# Patient Record
Sex: Female | Born: 1985 | Hispanic: No | Marital: Single | Smoking: Current some day smoker
Health system: Southern US, Community
[De-identification: ages and names within clinical notes are randomized; demographics above are authoritative.]

## PROBLEM LIST (undated history)

## (undated) DIAGNOSIS — J45909 Unspecified asthma, uncomplicated: Secondary | ICD-10-CM

---

## 2020-04-11 ENCOUNTER — Inpatient Hospital Stay (HOSPITAL_COMMUNITY)
Admission: AD | Admit: 2020-04-11 | Discharge: 2020-04-13 | DRG: 832 | Discharge status: Against Medical Advice | Payer: Medicaid Other | Attending: Obstetrics and Gynecology | Admitting: Obstetrics and Gynecology

## 2020-04-11 ENCOUNTER — Encounter (HOSPITAL_COMMUNITY): Payer: Self-pay | Admitting: Obstetrics and Gynecology

## 2020-04-11 ENCOUNTER — Other Ambulatory Visit: Payer: Self-pay

## 2020-04-11 DIAGNOSIS — O99013 Anemia complicating pregnancy, third trimester: Secondary | ICD-10-CM | POA: Diagnosis present

## 2020-04-11 DIAGNOSIS — O365932 Maternal care for other known or suspected poor fetal growth, third trimester, fetus 2: Secondary | ICD-10-CM | POA: Diagnosis present

## 2020-04-11 DIAGNOSIS — I10 Essential (primary) hypertension: Secondary | ICD-10-CM | POA: Diagnosis present

## 2020-04-11 DIAGNOSIS — O99323 Drug use complicating pregnancy, third trimester: Secondary | ICD-10-CM | POA: Diagnosis present

## 2020-04-11 DIAGNOSIS — Z3A29 29 weeks gestation of pregnancy: Secondary | ICD-10-CM

## 2020-04-11 DIAGNOSIS — O09893 Supervision of other high risk pregnancies, third trimester: Secondary | ICD-10-CM

## 2020-04-11 DIAGNOSIS — J45909 Unspecified asthma, uncomplicated: Secondary | ICD-10-CM | POA: Diagnosis present

## 2020-04-11 DIAGNOSIS — O30049 Twin pregnancy, dichorionic/diamniotic, unspecified trimester: Secondary | ICD-10-CM | POA: Diagnosis present

## 2020-04-11 DIAGNOSIS — E669 Obesity, unspecified: Secondary | ICD-10-CM | POA: Diagnosis present

## 2020-04-11 DIAGNOSIS — O30043 Twin pregnancy, dichorionic/diamniotic, third trimester: Secondary | ICD-10-CM | POA: Diagnosis present

## 2020-04-11 DIAGNOSIS — Z720 Tobacco use: Secondary | ICD-10-CM | POA: Diagnosis present

## 2020-04-11 DIAGNOSIS — O365911 Maternal care for other known or suspected poor fetal growth, first trimester, fetus 1: Secondary | ICD-10-CM | POA: Diagnosis present

## 2020-04-11 DIAGNOSIS — D649 Anemia, unspecified: Secondary | ICD-10-CM | POA: Diagnosis present

## 2020-04-11 DIAGNOSIS — O99513 Diseases of the respiratory system complicating pregnancy, third trimester: Secondary | ICD-10-CM | POA: Diagnosis present

## 2020-04-11 DIAGNOSIS — O0933 Supervision of pregnancy with insufficient antenatal care, third trimester: Secondary | ICD-10-CM | POA: Diagnosis not present

## 2020-04-11 DIAGNOSIS — O4703 False labor before 37 completed weeks of gestation, third trimester: Secondary | ICD-10-CM | POA: Diagnosis not present

## 2020-04-11 DIAGNOSIS — O365990 Maternal care for other known or suspected poor fetal growth, unspecified trimester, not applicable or unspecified: Secondary | ICD-10-CM

## 2020-04-11 DIAGNOSIS — O0970 Supervision of high risk pregnancy due to social problems, unspecified trimester: Secondary | ICD-10-CM

## 2020-04-11 DIAGNOSIS — O99019 Anemia complicating pregnancy, unspecified trimester: Secondary | ICD-10-CM | POA: Diagnosis present

## 2020-04-11 DIAGNOSIS — O99333 Smoking (tobacco) complicating pregnancy, third trimester: Secondary | ICD-10-CM | POA: Diagnosis present

## 2020-04-11 DIAGNOSIS — F32A Depression, unspecified: Secondary | ICD-10-CM | POA: Diagnosis present

## 2020-04-11 DIAGNOSIS — F1290 Cannabis use, unspecified, uncomplicated: Secondary | ICD-10-CM | POA: Diagnosis present

## 2020-04-11 DIAGNOSIS — O99340 Other mental disorders complicating pregnancy, unspecified trimester: Secondary | ICD-10-CM | POA: Diagnosis present

## 2020-04-11 DIAGNOSIS — Z91199 Patient's noncompliance with other medical treatment and regimen due to unspecified reason: Secondary | ICD-10-CM

## 2020-04-11 DIAGNOSIS — Z5329 Procedure and treatment not carried out because of patient's decision for other reasons: Secondary | ICD-10-CM | POA: Diagnosis present

## 2020-04-11 DIAGNOSIS — O09293 Supervision of pregnancy with other poor reproductive or obstetric history, third trimester: Secondary | ICD-10-CM | POA: Diagnosis not present

## 2020-04-11 DIAGNOSIS — O99213 Obesity complicating pregnancy, third trimester: Secondary | ICD-10-CM | POA: Diagnosis present

## 2020-04-11 DIAGNOSIS — Z3A30 30 weeks gestation of pregnancy: Secondary | ICD-10-CM | POA: Diagnosis not present

## 2020-04-11 DIAGNOSIS — O99330 Smoking (tobacco) complicating pregnancy, unspecified trimester: Secondary | ICD-10-CM | POA: Diagnosis present

## 2020-04-11 DIAGNOSIS — Z20822 Contact with and (suspected) exposure to covid-19: Secondary | ICD-10-CM | POA: Diagnosis present

## 2020-04-11 DIAGNOSIS — O99210 Obesity complicating pregnancy, unspecified trimester: Secondary | ICD-10-CM | POA: Diagnosis present

## 2020-04-11 DIAGNOSIS — Z641 Problems related to multiparity: Secondary | ICD-10-CM

## 2020-04-11 DIAGNOSIS — O10913 Unspecified pre-existing hypertension complicating pregnancy, third trimester: Secondary | ICD-10-CM | POA: Diagnosis not present

## 2020-04-11 DIAGNOSIS — O0930 Supervision of pregnancy with insufficient antenatal care, unspecified trimester: Secondary | ICD-10-CM

## 2020-04-11 DIAGNOSIS — O365930 Maternal care for other known or suspected poor fetal growth, third trimester, not applicable or unspecified: Secondary | ICD-10-CM | POA: Diagnosis not present

## 2020-04-11 DIAGNOSIS — O4693 Antepartum hemorrhage, unspecified, third trimester: Secondary | ICD-10-CM | POA: Diagnosis present

## 2020-04-11 DIAGNOSIS — O0943 Supervision of pregnancy with grand multiparity, third trimester: Secondary | ICD-10-CM | POA: Diagnosis not present

## 2020-04-11 DIAGNOSIS — O365912 Maternal care for other known or suspected poor fetal growth, first trimester, fetus 2: Secondary | ICD-10-CM | POA: Diagnosis present

## 2020-04-11 HISTORY — DX: Unspecified asthma, uncomplicated: J45.909

## 2020-04-11 LAB — GLUCOSE, CAPILLARY
Glucose-Capillary: 105 mg/dL — ABNORMAL HIGH (ref 70–99)
Glucose-Capillary: 139 mg/dL — ABNORMAL HIGH (ref 70–99)

## 2020-04-11 LAB — CBC
HCT: 27.9 % — ABNORMAL LOW (ref 36.0–46.0)
Hemoglobin: 8.6 g/dL — ABNORMAL LOW (ref 12.0–15.0)
MCH: 29 pg (ref 26.0–34.0)
MCHC: 30.8 g/dL (ref 30.0–36.0)
MCV: 93.9 fL (ref 80.0–100.0)
Platelets: 317 10*3/uL (ref 150–400)
RBC: 2.97 MIL/uL — ABNORMAL LOW (ref 3.87–5.11)
RDW: 13 % (ref 11.5–15.5)
WBC: 13.7 10*3/uL — ABNORMAL HIGH (ref 4.0–10.5)
nRBC: 0 % (ref 0.0–0.2)

## 2020-04-11 LAB — WET PREP, GENITAL
Sperm: NONE SEEN
Trich, Wet Prep: NONE SEEN
Yeast Wet Prep HPF POC: NONE SEEN

## 2020-04-11 LAB — SARS CORONAVIRUS 2 BY RT PCR (HOSPITAL ORDER, PERFORMED IN ~~LOC~~ HOSPITAL LAB): SARS Coronavirus 2: NEGATIVE

## 2020-04-11 LAB — RAPID URINE DRUG SCREEN, HOSP PERFORMED
Amphetamines: NOT DETECTED
Barbiturates: NOT DETECTED
Benzodiazepines: NOT DETECTED
Cocaine: NOT DETECTED
Opiates: NOT DETECTED
Tetrahydrocannabinol: POSITIVE — AB

## 2020-04-11 LAB — ABO/RH: ABO/RH(D): AB POS

## 2020-04-11 LAB — HIV ANTIBODY (ROUTINE TESTING W REFLEX): HIV Screen 4th Generation wRfx: NONREACTIVE

## 2020-04-11 MED ORDER — LACTATED RINGERS IV SOLN: 500.0000 mL | INTRAVENOUS | Status: DC | PRN

## 2020-04-11 MED ORDER — LACTATED RINGERS IV SOLN: INTRAVENOUS | Status: DC

## 2020-04-11 MED ORDER — NIFEDIPINE ER OSMOTIC RELEASE 30 MG PO TB24
30.0000 mg | ORAL_TABLET | Freq: Once | ORAL | Status: AC
  Administered 2020-04-11: 30 mg via ORAL
  Filled 2020-04-11: qty 1

## 2020-04-11 MED ORDER — LIDOCAINE HCL (PF) 1 % IJ SOLN: 30.0000 mL | INTRAMUSCULAR | Status: DC | PRN

## 2020-04-11 MED ORDER — SOD CITRATE-CITRIC ACID 500-334 MG/5ML PO SOLN: 30.0000 mL | ORAL | Status: DC | PRN

## 2020-04-11 MED ORDER — PENICILLIN G POT IN DEXTROSE 60000 UNIT/ML IV SOLN: 3.0000 10*6.[IU] | INTRAVENOUS | Status: DC

## 2020-04-11 MED ORDER — NIFEDIPINE 10 MG PO CAPS
10.0000 mg | ORAL_CAPSULE | Freq: Once | ORAL | Status: AC
  Administered 2020-04-11: 10 mg via ORAL
  Filled 2020-04-11: qty 1

## 2020-04-11 MED ORDER — SODIUM CHLORIDE 0.9 % IV SOLN: 2.0000 g | Freq: Four times a day (QID) | INTRAVENOUS | Status: DC

## 2020-04-11 MED ORDER — NIFEDIPINE 10 MG PO CAPS: 10.0000 mg | ORAL_CAPSULE | Freq: Three times a day (TID) | ORAL | Status: DC

## 2020-04-11 MED ORDER — BETAMETHASONE SOD PHOS & ACET 6 (3-3) MG/ML IJ SUSP: 12.0000 mg | Freq: Once | INTRAMUSCULAR | Status: DC

## 2020-04-11 MED ORDER — BETAMETHASONE SOD PHOS & ACET 6 (3-3) MG/ML IJ SUSP
12.0000 mg | Freq: Once | INTRAMUSCULAR | Status: AC
  Administered 2020-04-11: 12 mg via INTRAMUSCULAR
  Filled 2020-04-11: qty 5

## 2020-04-11 MED ORDER — ONDANSETRON HCL 4 MG/2ML IJ SOLN
4.0000 mg | Freq: Four times a day (QID) | INTRAMUSCULAR | Status: DC | PRN
  Administered 2020-04-12: 4 mg via INTRAVENOUS
  Filled 2020-04-11: qty 2

## 2020-04-11 MED ORDER — OXYTOCIN BOLUS FROM INFUSION: 333.0000 mL | Freq: Once | INTRAVENOUS | Status: DC

## 2020-04-11 MED ORDER — MAGNESIUM SULFATE BOLUS VIA INFUSION
4.0000 g | Freq: Once | INTRAVENOUS | Status: AC
  Administered 2020-04-11: 4 g via INTRAVENOUS
  Filled 2020-04-11: qty 1000

## 2020-04-11 MED ORDER — ZOLPIDEM TARTRATE 5 MG PO TABS
5.0000 mg | ORAL_TABLET | Freq: Every evening | ORAL | Status: DC | PRN
  Administered 2020-04-12 – 2020-04-13 (×2): 5 mg via ORAL
  Filled 2020-04-11 (×2): qty 1

## 2020-04-11 MED ORDER — SODIUM CHLORIDE 0.9 % IV SOLN
2.0000 g | INTRAVENOUS | Status: DC
  Administered 2020-04-11 (×2): 2 g via INTRAVENOUS
  Filled 2020-04-11 (×2): qty 2000

## 2020-04-11 MED ORDER — HYDROMORPHONE HCL 1 MG/ML IJ SOLN
0.5000 mg | INTRAMUSCULAR | Status: AC | PRN
  Administered 2020-04-12 (×2): 0.5 mg via INTRAVENOUS
  Filled 2020-04-11: qty 1
  Filled 2020-04-11: qty 0.5

## 2020-04-11 MED ORDER — SODIUM CHLORIDE 0.9 % IV SOLN: 5.0000 10*6.[IU] | Freq: Once | INTRAVENOUS | Status: DC

## 2020-04-11 MED ORDER — GENTAMICIN SULFATE 40 MG/ML IJ SOLN: 5.0000 mg/kg | INTRAVENOUS | Status: DC

## 2020-04-11 MED ORDER — MAGNESIUM SULFATE 40 GM/1000ML IV SOLN
2.0000 g/h | INTRAVENOUS | Status: AC
  Administered 2020-04-11 – 2020-04-12 (×2): 2 g/h via INTRAVENOUS
  Filled 2020-04-11 (×2): qty 1000

## 2020-04-11 MED ORDER — OXYTOCIN-SODIUM CHLORIDE 30-0.9 UT/500ML-% IV SOLN: 2.5000 [IU]/h | INTRAVENOUS | Status: DC

## 2020-04-11 MED ORDER — OXYCODONE-ACETAMINOPHEN 5-325 MG PO TABS: 2.0000 | ORAL_TABLET | ORAL | Status: DC | PRN

## 2020-04-11 MED ORDER — FENTANYL CITRATE (PF) 100 MCG/2ML IJ SOLN
50.0000 ug | INTRAMUSCULAR | Status: DC | PRN
  Administered 2020-04-11 – 2020-04-13 (×8): 100 ug via INTRAVENOUS
  Filled 2020-04-11 (×8): qty 2

## 2020-04-11 MED ORDER — ACETAMINOPHEN 325 MG PO TABS: 650.0000 mg | ORAL_TABLET | ORAL | Status: DC | PRN

## 2020-04-11 MED ORDER — OXYCODONE-ACETAMINOPHEN 5-325 MG PO TABS: 1.0000 | ORAL_TABLET | ORAL | Status: DC | PRN

## 2020-04-11 NOTE — Progress Notes (Signed)
Labor Progress Note Julie Owens is a 34 y.o. [id number] at [redacted]w[redacted]d with didi twin IUP who presented with threatened preterm labor and vaginal bleeding.  S: Pt reports increasing pain with contractions s/p last cervical exam. She also reports notable stress given poor quality of sleep for days. No concern of ongoing vaginal bleeding per nursing.  O:  BP (!) 114/56    Pulse 80    Temp 98.3 F (36.8 C) (Oral)    Resp 16    Ht 5\' 5"  (1.651 m)    Wt 101.6 kg    SpO2 98%    BMI 37.28 kg/m  Twin A EFM: 120/mod variability/+ accels/no decels Twin B EFM: 120/mod variability/+ accels/no decels Toco: irritable   CVE: Dilation: 5 Effacement (%): 50 Station: -3 Presentation: Vertex Exam by:: Dr. [email or web address]   A&P: 34 y.o. 34 [redacted]w[redacted]d with didi twin IUP who presented for threatened preterm labor and vaginal bleeding.  #Threatened Preterm Labor with Vaginal Bleeding, DiDi Twins: Confirmed absence of previa at outside hospital with MFM. S/p betamethasone x 2 doses, received first dose during hospitalization at [facility] last night. Start tocolysis with magnesium sulfate 4mg  bolus followed by 2mg /hr maintenance dosing. She then was re-checked with no cervical change but subsequently began contracting after the cervical exam and was given procardia 10 mg x1 at 1840. Given persistent discomfort with contractions, pt received an additional dose of procardia XL 30mg  once at 2157. Will transfer pt to antepartum given unchanged cervical exam at 2100.  #Pain: Given ongoing discomfort with contractions despite IV fentanyl, will transition to dilaudid q4hr prn overnight in addition to prn Ambien for therapeutic rest.    #FWB: Cat I for twin A and twin B  #GBS: unknown, initiated Amp given preterm and concern for precipitous delivery; will transition to penicillin at this time and consider discontinuing if no longer concerns for active labor.  #Poor fetal growth: Anatomy scan not available for review. Per [facility] admission note, last MFM on 08/17 showed EFW of 1100g and 1000g. NICU aware, call for delivery.  #Chronic HTN: Not on medication. History of gHTN in previous pregnancy per chart review. Current blood pressures 100s-110s/50s. Continue to monitor.  #H/o GDM in previous pregnancy: No GTT during current pregnancy. Ordered random glucose level, 105, NPO except for lemonade since last night. Continue q4 glucose checks. Most recent BG checks 105 >139.  #Depression, Anxiety: History of depression, prescribed Lexapro 20mg  daily during pregnancy but did not start it due to concern over potential side effects. Slighlty anxious today. Postpartum SW consult.   #Maternal drug use: UDS positive for amphetamines and cannabis on 08/23. Admits to taking THC gummies but denies amphetamine use including Adderall. Repeat UDS pending, SW consult postpartum.  #Tobacco use during pregnancy: Smoking 3 cigarettes daily. Nicotine patch postpartum.  #Asthma: Chronic problem, not on meds, well-controlled.   #Limited prenatal care: Had 2 prenatal visits. Postpartum SW consult.  [facility], MD 9:12 PM

## 2020-04-11 NOTE — Consult Note (Signed)
Neonatology Consult to Antenatal Patient: 04/11/2020 5:12 PM    I was requested by Dr Adrian Blackwater to see this patient in order to provide antenatal counseling due to  Preterm labor at 29 6/7 weeks Di-Di Twin gestation.    Julie Owens is a 34 y/o G9P6 who was admitted today and is now 29 6/[redacted] weeks GA.  She was admitted to [facility] early yesterday, received BMZ and MgSO4 but left AMA.   Prenatal history significant for limited [facility], polysubstance abuse (+ THC and amphetamine at [facility] on 8/23), cHTN, IUGR and Di-Di twins. She is currently having active labor and around 5 cms dilated.  She is getting her second dose of BMZ, and started on Magnesium Sulfate and IV antibiotics for unknown GBS status.   I spoke with Julie Owens in Room 217.   We discussed in detail what to expect in case of possible delivery of the infant in the next few days including morbidity and mortality at this twin gestational age, usual delivery room resuscitation including intubation and surfactant administration in the DR.  Discussed possible respiratory complications and need for support including mechanical ventilation, IV access, sepsis work-up, NG/OG feedings ( MOB desires breast feeding, which was encouraged), risk for IVH with the potential for motor/cognitive deficits, length of stay and long-term outcome.  She was attentive, had no questions, but expressed appreciation for my input.   Thank you for asking [place] to see this patient and allowing [place] to participate in her care.  Please call if there are any further questions.   Overton Mam, MD (Attending Neonatologist)  Total length of face-to-face or floor/unit time for this encounter was 40 minutes. Counseling and/or coordination of care was greater than fifty percent of the time above.

## 2020-04-11 NOTE — Progress Notes (Signed)
Labor Progress Note Julie Owens is a 34 y.o. [id number] at [redacted]w[redacted]d with didi twin IUP who presented with threatened preterm labor.  S: Doing well overall, not feeling contractions.  O:  BP 109/60    Pulse 78    Temp 98.6 F (37 C) (Oral)    Resp 14    Ht 5\' 5"  (1.651 m)    Wt 101.6 kg    SpO2 98%    BMI 37.28 kg/m  Twin A EFM: 120/mod variability/+ accels/no decels Twin B EFM: 120/mod variability/+ accels/no decels Toco: irritable   CVE: Dilation: 5 Effacement (%): 50 Station: -2 Presentation: Vertex Exam by:: Dr. [email or web address]   A&P: 34 y.o. 34 [redacted]w[redacted]d with didi twin IUP who presented for threatened preterm labor.  #Threatened Preterm Labor, DiDi Twins: S/p betamethasone x 2 doses, received first dose during hospitalization at [facility] last night. Start tocolysis with magnesium sulfate 4mg  bolus followed by 2mg /hr maintenance dosing. She then was re-checked with no cervical change but subsequently began contracting after the cervical exam and was given procardia 10 mg x1, with subsequent resolution in contractions. Will plan for repeat cervical exam in 2-4 hours and move to antepartum if stable.  #Pain: Per patient request #FWB: Cat I for twin A and twin B #GBS unknown, will treat with Amp given preterm and concern for precipitous delivery   #Poor fetal growth: Anatomy scan not available for review. Per [facility] admission note, last MFM on 08/17 showed EFW of 1100g and 1000g. NICU aware, call for delivery.   #Chronic HTN: Not on medication. History of gHTN in previous pregnancy per chart review. Current blood pressures 100s-110s/50s. Continue to monitor.  #H/o GDM in previous pregnancy: No GTT during current pregnancy. Ordered random glucose level, 105, NPO except for lemonade since last night. Will order q4 glucose checks.  #Depression, Anxiety: History of depression, prescribed Lexapro 20mg  daily during pregnancy but did not start it due to concern over potential side  effects. Slighlty anxious today. Postpartum SW consult.   #Maternal drug use: UDS positive for amphetamines and cannabis on 08/23. Admits to taking THC gummies but denies amphetamine use including Adderall. Repeat UDS pending, SW consult postpartum.  #Tobacco use during pregnancy: Smoking 3 cigarettes daily. Nicotine patch postpartum.  #Asthma: Chronic problem, not on meds, well-controlled.   #Limited prenatal care: Had 2 prenatal visits. Postpartum SW consult.  [place], MD 7:05 PM

## 2020-04-11 NOTE — H&P (Signed)
OBSTETRIC ADMISSION HISTORY AND PHYSICAL  Julie Owens is a 34 y.o. female [phone] with IUP at [redacted]w[redacted]d presenting for ctx and VB. She reports intermittent VB x2 days. At times she passes clots. Reports irregular mild ctx. She denies LOF or watery discharge. She reports +FMs. She was admitted to [facility] early yesterday and given BMZ and MgSO4 but she left AMA  Dating: By LMP --->  Estimated Date of Delivery: 06/21/20  Prenatal History/Complications: - didi twins - IUGR - grand multiparity - obesity - limited PNC (2 visits)  Past Medical History: Past Medical History:  Diagnosis Date  . Asthma    Past Surgical History: History reviewed. No pertinent surgical history.  Obstetrical History: OB History    Gravida  9   Para  6   Term  6   Preterm      AB  2   Living  6     SAB  2   TAB      Ectopic      Multiple      Live Births  6           Social History: Social History   Socioeconomic History  . Marital status: Single    Spouse name: Not on file  . Number of children: Not on file  . Years of education: Not on file  . Highest education level: Not on file  Occupational History  . Not on file  Tobacco Use  . Smoking status: Current Some Day Smoker  . Smokeless tobacco: Never Used  Vaping Use  . Vaping Use: Some days  Substance and Sexual Activity  . Alcohol use: Never  . Drug use: Never  . Sexual activity: Yes  Other Topics Concern  . Not on file  Social History Narrative  . Not on file   Social Determinants of Health   Financial Resource Strain:   . Difficulty of Paying Living Expenses: Not on file  Food Insecurity:   . Worried About Programme researcher, broadcasting/film/video in the Last Year: Not on file  . Ran Out of Food in the Last Year: Not on file  Transportation Needs:   . Lack of Transportation (Medical): Not on file  . Lack of Transportation (Non-Medical): Not on file  Physical Activity:   . Days of Exercise per Week: Not on file  . Minutes of  Exercise per Session: Not on file  Stress:   . Feeling of Stress : Not on file  Social Connections:   . Frequency of Communication with Friends and Family: Not on file  . Frequency of Social Gatherings with Friends and Family: Not on file  . Attends Religious Services: Not on file  . Active Member of Clubs or Organizations: Not on file  . Attends Banker Meetings: Not on file  . Marital Status: Not on file    Family History: Family History  Problem Relation Age of Onset  . Diabetes Father     Allergies: No Known Allergies  No medications prior to admission.   Review of Systems:  All systems reviewed and negative except as stated in HPI  PE: Blood pressure 114/60, pulse 89, temperature 98.6 F (37 C), temperature source Oral, resp. rate 12, SpO2 98 %. General appearance: alert, cooperative and no distress Lungs: regular rate and effort Heart: regular rate  Abdomen: soft, non-tender Extremities: Homans sign is negative, no sign of DVT GU: SSE: moderate amt of blood in vault, no active bleeding Presentation: cephalic (  A) EFM-A: 145 bpm, mod variability, + accels, no decels EFM-B: 140 bpm, mod variability, + accels, no decels Toco: rare SVE: 5/60/-2  Prenatal Transfer Tool  Maternal Diabetes: not tested Genetic Screening: Normal Maternal Ultrasounds/Referrals: IUGR Fetal Ultrasounds or other Referrals:  Referred to Materal Fetal Medicine  Maternal Substance Abuse:  No Significant Maternal Medications:  None Significant Maternal Lab Results: None  Assessment: Julie Owens is a 34 y.o. W7P7106 at [redacted]w[redacted]d with didi twins here for threatened PTL and Vaginal bleeding   Plan: Admit to LD BMZ (second dose) MgSO4 PCN Mgt per Dr. Gweneth Dimitri, CNM  04/11/2020, 2:08 PM

## 2020-04-11 NOTE — MAU Note (Addendum)
Pt reports being at [facility] yesterday. Pt reports having an epidural yesterday. Pt reports leaving because she did not think she was getting the right treatment.   Pt reports she was 4 cm yesterday.   Reports vaginal bleeding.   Pt reports when she pees she see's big blood clots.   Denies LOF.    Pt reports decreased fetal movement since leaving the hospital

## 2020-04-11 NOTE — Progress Notes (Signed)
Labor Progress Note Julie Owens is a 34 y.o. [id number] at [redacted]w[redacted]d with didi twin IUP who presented in latent labor.   S: Doing well overall, now feeling contractions every 7-10 minutes.  O:  BP 108/76   Pulse 82   Temp 98.6 F (37 C) (Oral)   Resp 14   Ht 5\' 5"  (1.651 m)   Wt 101.6 kg   SpO2 98%   BMI 37.28 kg/m  Twin A EFM: 140/mod variability/+ accels/no decels Twin B EFM: 140/mod variability/+ accels/no decels  CVE: Dilation: 5 Exam by:: [email or web address], CNM   A&P: 34 y.o. 34 [redacted]w[redacted]d with didi twin gestation presenting for latent labor.  #Threatened Preterm Labor, DiDi Twins: S/p betamethasone x 2 doses, received first dose during hospitalization at [facility] last night. Start tocolysis with magnesium sulfate 4mg  bolus followed by 2mg /hr maintenance dosing.  #Pain: Per patient request #FWB: Cat I for twin A and twin B #GBS unknown, will treat with Amp given preterm and concern for precipitous delivery   #Poor fetal growth: Anatomy scan not available for review. Per [facility] admission note, last MFM on 08/17 showed EFW of 1100g and 1000g. NICU aware, call for delivery.   #Chronic HTN: Not on medication. History of gHTN in previous pregnancy per chart review. Current blood pressures 100s-110s/50s. Continue to monitor.  #H/o GDM in previous pregnancy: No GTT during current pregnancy. Ordered random glucose level, consider additional glucose monitoring throughout labor course depending on result.  #Depression, Anxiety: History of depression, prescribed Lexapro 20mg  daily during pregnancy but did not start it due to concern over potential side effects. Slighlty anxious today. Postpartum SW consult.   #Maternal drug use: UDS positive for amphetamines and cannabis on 08/23. Admits to taking THC gummies but denies amphetamine use including Adderall. Repeat UDS pending, SW consult postpartum.  #Tobacco use during pregnancy: Smoking 3 cigarettes daily. Nicotine patch  postpartum.  #Asthma: Chronic problem, well-controlled. Avoid use of Hemabate.  #Limited prenatal care: Had 2 prenatal visits. Postpartum SW consult.  [place], MD 3:44 PM

## 2020-04-11 NOTE — Progress Notes (Signed)
Patient frustrated when attempting to transfer to antenatal. Patient asked lots of questions and expressed concerns. With explanations provided on plan of care, patient's frustrations increased and expressed desire to leave the hospital. I shared that I would contact [place], DO to come and meet with her to answer any questions and concerns. Patient removed monitors and requested to be saline locked.

## 2020-04-12 ENCOUNTER — Encounter (HOSPITAL_COMMUNITY): Payer: Self-pay | Admitting: Obstetrics and Gynecology

## 2020-04-12 DIAGNOSIS — O4693 Antepartum hemorrhage, unspecified, third trimester: Secondary | ICD-10-CM

## 2020-04-12 DIAGNOSIS — O0970 Supervision of high risk pregnancy due to social problems, unspecified trimester: Secondary | ICD-10-CM

## 2020-04-12 DIAGNOSIS — Z720 Tobacco use: Secondary | ICD-10-CM | POA: Diagnosis present

## 2020-04-12 DIAGNOSIS — O365912 Maternal care for other known or suspected poor fetal growth, first trimester, fetus 2: Secondary | ICD-10-CM | POA: Diagnosis present

## 2020-04-12 DIAGNOSIS — O10913 Unspecified pre-existing hypertension complicating pregnancy, third trimester: Secondary | ICD-10-CM

## 2020-04-12 DIAGNOSIS — Z641 Problems related to multiparity: Secondary | ICD-10-CM

## 2020-04-12 DIAGNOSIS — O30043 Twin pregnancy, dichorionic/diamniotic, third trimester: Secondary | ICD-10-CM

## 2020-04-12 DIAGNOSIS — E669 Obesity, unspecified: Secondary | ICD-10-CM | POA: Diagnosis present

## 2020-04-12 DIAGNOSIS — O09893 Supervision of other high risk pregnancies, third trimester: Secondary | ICD-10-CM

## 2020-04-12 DIAGNOSIS — O365930 Maternal care for other known or suspected poor fetal growth, third trimester, not applicable or unspecified: Secondary | ICD-10-CM

## 2020-04-12 DIAGNOSIS — O99019 Anemia complicating pregnancy, unspecified trimester: Secondary | ICD-10-CM | POA: Diagnosis present

## 2020-04-12 DIAGNOSIS — O4703 False labor before 37 completed weeks of gestation, third trimester: Secondary | ICD-10-CM

## 2020-04-12 DIAGNOSIS — Z3A30 30 weeks gestation of pregnancy: Secondary | ICD-10-CM

## 2020-04-12 DIAGNOSIS — Z91199 Patient's noncompliance with other medical treatment and regimen due to unspecified reason: Secondary | ICD-10-CM

## 2020-04-12 DIAGNOSIS — O99210 Obesity complicating pregnancy, unspecified trimester: Secondary | ICD-10-CM | POA: Diagnosis present

## 2020-04-12 LAB — URINALYSIS, ROUTINE W REFLEX MICROSCOPIC
Bilirubin Urine: NEGATIVE
Glucose, UA: NEGATIVE mg/dL
Ketones, ur: 5 mg/dL — AB
Nitrite: NEGATIVE
Protein, ur: NEGATIVE mg/dL
Specific Gravity, Urine: 1.008 (ref 1.005–1.030)
pH: 6 (ref 5.0–8.0)

## 2020-04-12 LAB — GC/CHLAMYDIA PROBE AMP (~~LOC~~) NOT AT ARMC
Chlamydia: NEGATIVE
Comment: NEGATIVE
Comment: NORMAL
Neisseria Gonorrhea: NEGATIVE

## 2020-04-12 LAB — GLUCOSE, CAPILLARY
Glucose-Capillary: 119 mg/dL — ABNORMAL HIGH (ref 70–99)
Glucose-Capillary: 124 mg/dL — ABNORMAL HIGH (ref 70–99)
Glucose-Capillary: 110 mg/dL — ABNORMAL HIGH (ref 70–99)
Glucose-Capillary: 107 mg/dL — ABNORMAL HIGH (ref 70–99)

## 2020-04-12 LAB — RPR: RPR Ser Ql: NONREACTIVE

## 2020-04-12 LAB — PREPARE RBC (CROSSMATCH)

## 2020-04-12 MED ORDER — PENICILLIN G POT IN DEXTROSE 60000 UNIT/ML IV SOLN: 3.0000 10*6.[IU] | INTRAVENOUS | Status: DC

## 2020-04-12 MED ORDER — PENICILLIN G POT IN DEXTROSE 60000 UNIT/ML IV SOLN
3.0000 10*6.[IU] | INTRAVENOUS | Status: DC
  Administered 2020-04-12 (×2): 3 10*6.[IU] via INTRAVENOUS
  Filled 2020-04-12 (×2): qty 50

## 2020-04-12 MED ORDER — SODIUM CHLORIDE 0.9 % IV SOLN
5.0000 10*6.[IU] | Freq: Once | INTRAVENOUS | Status: AC
  Administered 2020-04-12: 5 10*6.[IU] via INTRAVENOUS
  Filled 2020-04-12: qty 5

## 2020-04-12 MED ORDER — SODIUM CHLORIDE 0.9 % IV SOLN: 5.0000 10*6.[IU] | Freq: Once | INTRAVENOUS | Status: DC

## 2020-04-12 MED ORDER — ALPRAZOLAM 0.5 MG PO TABS
1.0000 mg | ORAL_TABLET | Freq: Once | ORAL | Status: AC
  Administered 2020-04-12: 1 mg via ORAL
  Filled 2020-04-12: qty 2

## 2020-04-12 MED ORDER — SODIUM CHLORIDE 0.9% IV SOLUTION: Freq: Once | INTRAVENOUS | Status: DC

## 2020-04-12 NOTE — Progress Notes (Signed)
Unable to trace babies from 1300 until 1415 due to pt walking around the room.

## 2020-04-12 NOTE — Progress Notes (Signed)
Went to bedside given pt's request to speak with the provider regarding medical indication for continued hospitalization. Social worker also at bedside and discussing contingency plan for pt's other 2 children in the event that she would need to stay in the hospital for an extended period of time. Pt became very tearful with conversation and reported concerns of "wanting to be home with my other children who need me". Chartered loss adjuster discussed medical indication of continued hospitalization including potential life-threatening risk for twins in utero if she were to deliver precipitously at home. Pt endorsed understanding of need for continued hospitalization, at least overnight, with plan to recheck her cervix at 0200 on 8/26, 6 hours s/p discontinuation of magnesium.  Sheila Oats, MD OB Fellow, Faculty Practice 04/12/2020 10:50 PM

## 2020-04-12 NOTE — Progress Notes (Signed)
Down to see patient at request of RN for pressure and contractions. Pt reports increase in contractions and pressure in last 20 min or so, feels it is because she is "worked up" and worried about her other children.   BP 98/72 (BP Location: Left Arm)   Pulse 77   Temp 98.4 F (36.9 C) (Oral)   Resp 18   Ht 5\' 5"  (1.651 m)   Wt 101.6 kg   SpO2 100%   BMI 37.28 kg/m   Gen: alert, oriented, moderate distress, breathing through contractions SVE: 5/50/-3   A/P: 34 yo 34 @ [redacted]w[redacted]d with di/di twins who presents with advanced cervical dilation. Was 5 overnight with increased pressure and contractions now. Cervical exam unchanged from last night but given prematurity, grand multiparity and pressure/contractions, will transfer to L&D for further monitoring.   S/p BTMZ x2 Mag x 48 hours, due to stop this afternoon cHTN   Discussed with Dr. [redacted]w[redacted]d  K. Ashok Pall, M.D. Attending Center for [facility] [employer])

## 2020-04-12 NOTE — Progress Notes (Signed)
Pt called out for RN, stating she was feeling lots of pressure and her ctx were getting much stronger and frequent. Notified Dr. Earlene Plater. Dr. Earlene Plater en route to department.

## 2020-04-12 NOTE — Progress Notes (Signed)
L&D Note  04/12/2020 - 6:00 PM  34 y.o. [id number] [redacted]w[redacted]d. Pregnancy complicated by:  Patient Active Problem List   Diagnosis Date Noted  . Short interval between pregnancies affecting pregnancy in third trimester, antepartum 04/12/2020  . Grand multiparity 04/12/2020  . Poor compliance 04/12/2020  . Supervision of high risk pregnancy due to social problems 04/12/2020  . Tobacco abuse 04/12/2020  . Obesity in pregnancy 04/12/2020  . BMI 30s 04/12/2020  . [redacted] weeks gestation of pregnancy   . Threatened preterm labor, third trimester 04/11/2020  . Dichorionic diamniotic twin pregnancy 04/11/2020  . Substance abuse affecting pregnancy in third trimester, antepartum 04/11/2020  . Limited prenatal care 04/11/2020  . Asthma 04/11/2020  . Tobacco use during pregnancy 04/11/2020  . Poor fetal growth affecting management of mother in third trimester 04/11/2020  . Depression affecting pregnancy 04/11/2020  . Chronic hypertension 04/11/2020    [name] is admitted for PTL   Subjective:  Feeling well. No UCs, VB, LOF  Objective:    Current Vital Signs 24h Vital Sign Ranges  T (!) 97 F (36.1 C) Temp  Avg: 97.9 F (36.6 C)  Min: 97 F (36.1 C)  Max: 98.4 F (36.9 C)  BP 114/67 BP  Min: 98/72  Max: 124/58  HR 77 Pulse  Avg: 76.5  Min: 70  Max: 85  RR 20 Resp  Avg: 16.9  Min: 15  Max: 20  SaO2 100 %  (Room Air) SpO2  Avg: 99.8 %  Min: 99 %  Max: 100 %       24 Hour I/O Current Shift I/O  Time Ins Outs 08/24 0701 - 08/25 0700 In: 5508.8 [P.O.:3010; I.V.:2248.8] Out: 4600 [Urine:4600] 08/25 0701 - 08/25 1900 In: 2151.5 [P.O.:800; I.V.:1101.5] Out: 500 [Urine:500]   FHR:  A 125 baseline, no accel or decel, min to mod variability B 130 baseline, no accel or decel, min to mod variability Toco: quiet Gen: NAD Abdomen: gravid, nttp SVE: 4-5/50/mid position/medium consistency/high/cephalic, BOW intact  Labs:  Recent Labs  Lab 04/11/20 1407  WBC 13.7*  HGB 8.6*  HCT 27.9*   PLT 317   No results for input(s): NA, K, CL, CO2, BUN, CREATININE, CALCIUM, PROT, BILITOT, ALKPHOS, ALT, AST, GLUCOSE in the last 168 hours.  Invalid input(s): LABALBU  Medications Current Facility-Administered Medications  Medication Dose Route Frequency Provider Last Rate Last Admin  . 0.9 %  sodium chloride infusion (Manually program via Guardrails IV Fluids)   Intravenous Once Wouk, Wilfred Curtis, MD      . acetaminophen (TYLENOL) tablet 650 mg  650 mg Oral Q4H PRN Sheila Oats, MD      . fentaNYL (SUBLIMAZE) injection 50-100 mcg  50-100 mcg Intravenous Q1H PRN Sheila Oats, MD   100 mcg at 04/12/20 1529  . lactated ringers infusion 500-1,000 mL  500-1,000 mL Intravenous PRN Sheila Oats, MD      . lactated ringers infusion   Intravenous Continuous Sheila Oats, MD 75 mL/hr at 04/12/20 0910 New Bag at 04/12/20 0910  . lidocaine (PF) (XYLOCAINE) 1 % injection 30 mL  30 mL Subcutaneous PRN Sheila Oats, MD      . ondansetron [facility]) injection 4 mg  4 mg Intravenous Q6H PRN Sheila Oats, MD      . oxyCODONE-acetaminophen (PERCOCET/ROXICET) 5-325 MG per tablet 1 tablet  1 tablet Oral Q4H PRN Sheila Oats, MD      . oxyCODONE-acetaminophen (PERCOCET/ROXICET) 5-325 MG per tablet 2  tablet  2 tablet Oral Q4H PRN Sheila Oats, MD      . oxytocin (PITOCIN) IV BOLUS FROM BAG  333 mL Intravenous Once Sheila Oats, MD      . oxytocin (PITOCIN) IV infusion 30 units in NS 500 mL - Premix  2.5 Units/hr Intravenous Continuous Sheila Oats, MD      . penicillin G potassium 3 Million Units in dextrose 74mL IVPB  3 Million Units Intravenous Q4H Arvilla Market, DO      . sodium citrate-citric acid (ORACIT) solution 30 mL  30 mL Oral Q2H PRN Sheila Oats, MD      . zolpidem (AMBIEN) tablet 5 mg  5 mg Oral QHS PRN Sheila Oats, MD   5 mg at 04/12/20 0002    Assessment & Plan:  Pt stable *IUP: category I-II x 2. Call [facility] tomorrow to try and get mid August  growth u/s results. No mention of abnormal dopplers in care everywhere. Will ask about BTL later. Needs GDM screening at some point.  *PTL: currently on Mg. D/c Mg later tonight and then leave on L&D and if still w/o labor s/s then okay to move to antepartum *Di-Di twins: cephalic/cephalic on ultrasound this morning. I d/w her re: delivery mode and if A or B becomes non cephalic I told her I would recommend c-section due to FGR x 2. If stays cephalic/cephalic then I'm comfortable with her having a vaginal delivery, which she desires.  *Preterm: BMZ #2 on 8/24 @ 1430. S/p Mg for fetal NP. S/p NICU consult *cHTN: no current issues *Anemia: one unit PRBC on hold.  *GBS: unknown. S/p multiple doses of amp and currently on PCN. If okay for transition to antepartum, then can d/c abx *Social: SW PRN *Analgesia: no current needs. No epidural in place  Cornelia Copa. MD Attending Center for [facility])

## 2020-04-12 NOTE — Progress Notes (Signed)
Antenatal Progress Note Julie Owens is a 34 y.o. [id number] at [redacted]w[redacted]d with didi twin IUP who presented with threatened preterm labor and vaginal bleeding.  S: Patient reports mild contractions. Mild vaginal bleeding overnight noted on underwear, none ongoing.   O:  BP (!) 111/56 (BP Location: Left Arm)   Pulse 77   Temp 97.9 F (36.6 C) (Oral)   Resp 16   Ht 5\' 5"  (1.651 m)   Wt 101.6 kg   SpO2 100%   BMI 37.28 kg/m  Twin A EFM: 120/mod variability/+ accels/no decels Twin B EFM: 120/mod variability/+ accels/no decels Toco: irritable   CVE: Dilation: 5 Effacement (%): 50 Station: -3 Presentation: Vertex Exam by:: Dr. [email or web address]  Cervical Exam 5/50/-3    A&P: 34 y.o. 34 [redacted]w[redacted]d with didi twin IUP who presented for threatened preterm labor and vaginal bleeding.  #Threatened Preterm Labor with Vaginal Bleeding, DiDi Twins: On magnesium sulfate, will continue until this afternoon when she is 48 hours after initial betamethasone dose, then continue Procardia XL as ordered.  Continue to monitor closely.   #Pain: Analgesia as needed.    #FWB: Cat I for twin A and twin B  #GBS: Pending at this time PCN needed if PTL  #Poor fetal growth: Anatomy scan not available for review. Per [facility] admission note, last MFM [facility] on 08/17 showed EFW of 1100g and 1000g. NICU aware, call for delivery.  #Chronic HTN: Not on medication. History of gHTN in previous pregnancy per chart review. Current blood pressures 100s-110s/50s. Continue to monitor.   9/17, MD, FACOG Obstetrician & Gynecologist, [facility] for [facility] 11:25 AM

## 2020-04-13 ENCOUNTER — Inpatient Hospital Stay (HOSPITAL_BASED_OUTPATIENT_CLINIC_OR_DEPARTMENT_OTHER): Payer: Medicaid Other

## 2020-04-13 ENCOUNTER — Inpatient Hospital Stay: Payer: Medicaid Other

## 2020-04-13 DIAGNOSIS — O09893 Supervision of other high risk pregnancies, third trimester: Secondary | ICD-10-CM

## 2020-04-13 DIAGNOSIS — J45909 Unspecified asthma, uncomplicated: Secondary | ICD-10-CM

## 2020-04-13 DIAGNOSIS — O99891 Other specified diseases and conditions complicating pregnancy: Secondary | ICD-10-CM

## 2020-04-13 DIAGNOSIS — O09293 Supervision of pregnancy with other poor reproductive or obstetric history, third trimester: Secondary | ICD-10-CM

## 2020-04-13 DIAGNOSIS — O99333 Smoking (tobacco) complicating pregnancy, third trimester: Secondary | ICD-10-CM

## 2020-04-13 DIAGNOSIS — O0943 Supervision of pregnancy with grand multiparity, third trimester: Secondary | ICD-10-CM

## 2020-04-13 DIAGNOSIS — O365930 Maternal care for other known or suspected poor fetal growth, third trimester, not applicable or unspecified: Secondary | ICD-10-CM

## 2020-04-13 DIAGNOSIS — O30043 Twin pregnancy, dichorionic/diamniotic, third trimester: Secondary | ICD-10-CM

## 2020-04-13 DIAGNOSIS — O0933 Supervision of pregnancy with insufficient antenatal care, third trimester: Secondary | ICD-10-CM | POA: Diagnosis not present

## 2020-04-13 DIAGNOSIS — O99213 Obesity complicating pregnancy, third trimester: Secondary | ICD-10-CM

## 2020-04-13 DIAGNOSIS — E669 Obesity, unspecified: Secondary | ICD-10-CM

## 2020-04-13 DIAGNOSIS — O4693 Antepartum hemorrhage, unspecified, third trimester: Secondary | ICD-10-CM

## 2020-04-13 DIAGNOSIS — O99323 Drug use complicating pregnancy, third trimester: Secondary | ICD-10-CM

## 2020-04-13 DIAGNOSIS — Z363 Encounter for antenatal screening for malformations: Secondary | ICD-10-CM

## 2020-04-13 DIAGNOSIS — F1910 Other psychoactive substance abuse, uncomplicated: Secondary | ICD-10-CM

## 2020-04-13 DIAGNOSIS — Z3A30 30 weeks gestation of pregnancy: Secondary | ICD-10-CM

## 2020-04-13 DIAGNOSIS — O10013 Pre-existing essential hypertension complicating pregnancy, third trimester: Secondary | ICD-10-CM

## 2020-04-13 DIAGNOSIS — F17200 Nicotine dependence, unspecified, uncomplicated: Secondary | ICD-10-CM

## 2020-04-13 LAB — CULTURE, BETA STREP (GROUP B ONLY)

## 2020-04-13 LAB — GLUCOSE, CAPILLARY
Glucose-Capillary: 95 mg/dL (ref 70–99)
Glucose-Capillary: 93 mg/dL (ref 70–99)

## 2020-04-13 NOTE — Progress Notes (Addendum)
FACULTY PRACTICE ANTEPARTUM PROGRESS NOTE  Julie Owens is a 34 y.o. [id number] at [id number] who is admitted for Preterm labor.  Estimated Date of Delivery: 06/21/20 Fetal presentation is cephalic and cephalic.  Length of Stay:  2 Days. Admitted 04/11/2020  Subjective:  Patient reports normal fetal movement.  She denies uterine contractions, denies bleeding and leaking of fluid per vagina. Does report some very minor spotting last night. She is feeling well and wondering when she can leave  Vitals:  Blood pressure 123/72, pulse 72, temperature 98.7 F (37.1 C), temperature source Oral, resp. rate 18, height 5\' 5"  (1.651 m), weight 101.6 kg, SpO2 100 %. Physical Examination: CONSTITUTIONAL: Well-developed, well-nourished female in no acute distress.  HENT:  Normocephalic, atraumatic, External right and left ear normal. Oropharynx is clear and moist EYES: Conjunctivae and EOM are normal. Pupils are equal, round, and reactive to light. No scleral icterus.  NECK: Normal range of motion, supple, no masses. SKIN: Skin is warm and dry. No rash noted. Not diaphoretic. No erythema. No pallor. NEUROLGIC: Alert and oriented to person, place, and time. Normal reflexes, muscle tone coordination. No cranial nerve deficit noted. PSYCHIATRIC: Normal mood and affect. Normal behavior. Normal judgment and thought content. CARDIOVASCULAR: Normal heart rate noted RESPIRATORY: Effort normal, no problems with respiration noted MUSCULOSKELETAL: Normal range of motion. No edema and no tenderness. ABDOMEN: Soft, nontender, nondistended, gravid. CERVIX: deferred  Fetal monitoring: B FHR: 125 bpm, Variability: moderate, Accelerations: Present, Decelerations: Absent A: difficult to trace due to maternal position FHR: 125 bpm, moderate variabiliyt, accels present, no decels  Uterine activity: no contractions per hour  Results for orders placed or performed during the hospital encounter of 04/11/20 (from the past 48 hour(s))   Wet prep, genital     Status: Abnormal   Collection Time: 04/11/20  2:07 PM  Result Value Ref Range   Yeast Wet Prep HPF POC NONE SEEN NONE SEEN   Trich, Wet Prep NONE SEEN NONE SEEN   Clue Cells Wet Prep HPF POC PRESENT (A) NONE SEEN   WBC, Wet Prep HPF POC FEW (A) NONE SEEN   Sperm NONE SEEN     Comment: Performed at [facility], [address] [place], [place] [address]  GC/Chlamydia probe amp ([place])not at [facility]     Status: None   Collection Time: 04/11/20  2:07 PM  Result Value Ref Range   Neisseria Gonorrhea Negative    Chlamydia Negative    Comment Normal Reference Ranger Chlamydia - Negative    Comment      Normal Reference Range Neisseria Gonorrhea - Negative  CBC     Status: Abnormal   Collection Time: 04/11/20  2:07 PM  Result Value Ref Range   WBC 13.7 (H) 4.0 - 10.5 K/uL   RBC 2.97 (L) 3.87 - 5.11 MIL/uL   Hemoglobin 8.6 (L) 12.0 - 15.0 g/dL   HCT 81.127.9 (L) 36 - 46 %   MCV 93.9 80.0 - 100.0 fL   MCH 29.0 26.0 - 34.0 pg   MCHC 30.8 30.0 - 36.0 g/dL   RDW 91.413.0 78.211.5 - 95.615.5 %   Platelets 317 150 - 400 K/uL   nRBC 0.0 0.0 - 0.2 %    Comment: Performed at [facility], [address] [place], [place] [address]  RPR     Status: None   Collection Time: 04/11/20  2:07 PM  Result Value Ref Range   RPR Ser Ql NON REACTIVE NON REACTIVE  Comment: Performed at [facility], [address], [place], [place] [address]  HIV Antibody (routine testing w rflx)     Status: None   Collection Time: 04/11/20  2:07 PM  Result Value Ref Range   HIV Screen 4th Generation wRfx Non Reactive Non Reactive    Comment: Performed at [facility], [address], [place], [place] [address]  Type and screen [facility]     Status: None (Preliminary result)   Collection Time: 04/11/20  2:17 PM  Result Value Ref Range   ABO/RH(D) AB POS    Antibody Screen NEG    Sample Expiration      04/14/2020,2359 Performed at [facility], [address] [place], [place] [address]    Unit Number [id number]    Blood Component Type RED CELLS,LR    Unit division 00    Status of Unit ALLOCATED    Transfusion Status OK TO TRANSFUSE    Crossmatch Result Compatible   Culture, beta strep (group b only)     Status: None (Preliminary result)   Collection Time: 04/11/20  2:17 PM   Specimen: Vaginal/Rectal; Genital  Result Value Ref Range   Specimen Description VAGINAL/RECTAL    Special Requests NONE    Culture      CULTURE REINCUBATED FOR BETTER GROWTH Performed at [facility], [address] [place], [place] [address]    Report Status PENDING   ABO/Rh     Status: None   Collection Time: 04/11/20  3:52 PM  Result Value Ref Range   ABO/RH(D)      AB POS Performed at [facility], [address] [place], [place] [address]   SARS Coronavirus 2 by RT PCR (hospital order, performed in [facility] hospital lab) Nasopharyngeal Nasopharyngeal Swab     Status: None   Collection Time: 04/11/20  4:52 PM   Specimen: Nasopharyngeal Swab  Result Value Ref Range   SARS Coronavirus 2 NEGATIVE NEGATIVE    Comment: (NOTE) SARS-CoV-2 target nucleic acids are NOT DETECTED.  The SARS-CoV-2 RNA is generally detectable in upper and lower respiratory specimens during the acute phase of infection. The lowest concentration of SARS-CoV-2 viral copies this assay can detect is 250 copies / mL. A negative result does not preclude SARS-CoV-2 infection and should not be used as the sole basis for treatment or other patient management decisions.  A negative result may occur with improper specimen collection / handling, submission of specimen other than nasopharyngeal swab, presence of viral mutation(s) within the areas targeted by this assay, and inadequate number of viral copies (<250 copies / mL). A negative result must be combined with clinical observations, patient history, and epidemiological information.  Fact Sheet for Patients:    BoilerBrush.com.cy  Fact Sheet for Healthcare Providers: https://pope.com/  This test is not yet approved or  cleared by the [place] FDA and has been authorized for detection and/or diagnosis of SARS-CoV-2 by FDA under an Emergency Use Authorization (EUA).  This EUA will remain in effect (meaning this test can be used) for the duration of the COVID-19 declaration under Section 564(b)(1) of the Act, 21 U.S.C. section 360bbb-3(b)(1), unless the authorization is terminated or revoked sooner.  Performed at [facility], [address], [place], [place] [address]   Glucose, capillary     Status: Abnormal   Collection Time: 04/11/20  5:18 PM  Result Value Ref Range   Glucose-Capillary 105 (  H) 70 - 99 mg/dL    Comment: Glucose reference range applies only to samples taken after fasting for at least 8 hours.  Urine rapid drug screen (hosp performed)     Status: Abnormal   Collection Time: 04/11/20  5:23 PM  Result Value Ref Range   Opiates NONE DETECTED NONE DETECTED   Cocaine NONE DETECTED NONE DETECTED   Benzodiazepines NONE DETECTED NONE DETECTED   Amphetamines NONE DETECTED NONE DETECTED   Tetrahydrocannabinol POSITIVE (A) NONE DETECTED   Barbiturates NONE DETECTED NONE DETECTED    Comment: (NOTE) DRUG SCREEN FOR MEDICAL PURPOSES ONLY.  IF CONFIRMATION IS NEEDED FOR ANY PURPOSE, NOTIFY LAB WITHIN 5 DAYS.  LOWEST DETECTABLE LIMITS FOR URINE DRUG SCREEN Drug Class                     Cutoff (ng/mL) Amphetamine and metabolites    1000 Barbiturate and metabolites    200 Benzodiazepine                 200 Tricyclics and metabolites     300 Opiates and metabolites        300 Cocaine and metabolites        300 THC                            50 Performed at [facility], [address], [place], [place] [address]   Urinalysis, Routine w reflex microscopic Urine, Clean Catch     Status: Abnormal   Collection Time:  04/11/20  5:23 PM  Result Value Ref Range   Color, Urine YELLOW YELLOW   APPearance CLEAR CLEAR   Specific Gravity, Urine 1.008 1.005 - 1.030   pH 6.0 5.0 - 8.0   Glucose, UA NEGATIVE NEGATIVE mg/dL   Hgb urine dipstick LARGE (A) NEGATIVE   Bilirubin Urine NEGATIVE NEGATIVE   Ketones, ur 5 (A) NEGATIVE mg/dL   Protein, ur NEGATIVE NEGATIVE mg/dL   Nitrite NEGATIVE NEGATIVE   Leukocytes,Ua TRACE (A) NEGATIVE   RBC / HPF 0-5 0 - 5 RBC/hpf   WBC, UA 6-10 0 - 5 WBC/hpf   Bacteria, UA RARE (A) NONE SEEN   Squamous Epithelial / LPF 6-10 0 - 5   Mucus PRESENT     Comment: Performed at [facility], [address], [place], [place] [address]  Glucose, capillary     Status: Abnormal   Collection Time: 04/11/20  9:12 PM  Result Value Ref Range   Glucose-Capillary 139 (H) 70 - 99 mg/dL    Comment: Glucose reference range applies only to samples taken after fasting for at least 8 hours.  Glucose, capillary     Status: Abnormal   Collection Time: 04/12/20  5:31 AM  Result Value Ref Range   Glucose-Capillary 119 (H) 70 - 99 mg/dL    Comment: Glucose reference range applies only to samples taken after fasting for at least 8 hours.  Glucose, capillary     Status: Abnormal   Collection Time: 04/12/20  8:52 AM  Result Value Ref Range   Glucose-Capillary 124 (H) 70 - 99 mg/dL    Comment: Glucose reference range applies only to samples taken after fasting for at least 8 hours.  Glucose, capillary     Status: Abnormal   Collection Time: 04/12/20 12:15 PM  Result Value Ref Range   Glucose-Capillary 110 (H) 70 - 99 mg/dL    Comment: Glucose  reference range applies only to samples taken after fasting for at least 8 hours.  Prepare RBC (crossmatch)     Status: None   Collection Time: 04/12/20  3:30 PM  Result Value Ref Range   Order Confirmation      ORDER PROCESSED BY BLOOD BANK Performed at [facility], [address], [place], [place] [address]   Glucose, capillary     Status:  Abnormal   Collection Time: 04/12/20 11:23 PM  Result Value Ref Range   Glucose-Capillary 107 (H) 70 - 99 mg/dL    Comment: Glucose reference range applies only to samples taken after fasting for at least 8 hours.  Glucose, capillary     Status: None   Collection Time: 04/13/20  8:15 AM  Result Value Ref Range   Glucose-Capillary 95 70 - 99 mg/dL    Comment: Glucose reference range applies only to samples taken after fasting for at least 8 hours.  Glucose, capillary     Status: None   Collection Time: 04/13/20 12:10 PM  Result Value Ref Range   Glucose-Capillary 93 70 - 99 mg/dL    Comment: Glucose reference range applies only to samples taken after fasting for at least 8 hours.    I have reviewed the patient's current medications.  ASSESSMENT: Active Problems:   Threatened preterm labor, third trimester   Dichorionic diamniotic twin pregnancy   Substance abuse affecting pregnancy in third trimester, antepartum   Limited prenatal care   Asthma   Tobacco use during pregnancy   IUGR (intrauterine growth restriction) affecting care of mother, first trimester, fetus 1   Depression affecting pregnancy   Chronic hypertension   [redacted] weeks gestation of pregnancy   Short interval between pregnancies affecting pregnancy in third trimester, antepartum   Grand multiparity   Poor compliance   Supervision of high risk pregnancy due to social problems   Tobacco abuse   Obesity in pregnancy   BMI 30s   Anemia in pregnancy   IUGR (intrauterine growth restriction) affecting care of mother, first trimester, fetus 2   PLAN:  Preterm labor - cont inhouse monitoring - reviewed importance of in house management for now, given risks of prematurity of babies if they are born at 67 w, she verbalizes understanding  Di/di twins/FWB - daily NST - s/p BTMZ 8/23-24 - s/p NICU consult  FGR - per notes at [facility], EFW 1100 & 1000g - growth [place] with dopplers today  H/o GDM - no GTT in  pregnancy - CBGs here wnl  cHTN - no meds - normotensive  Delivery mode - vtx/vtx - plan for vaginal delivery  Routine prenatal - will consider allowing [place] to do GTT tomorrow - declines to discuss contraception  Continue routine antenatal care.   Baldemar Lenis, M.D. Attending [facility] for [employer] (Faculty Practice)  04/13/2020 1:10 PM

## 2020-04-13 NOTE — Progress Notes (Signed)
Notified Dr. Vergie Living of difficulty keeping twins on continuous tracing from 0300-0600. Both FHR heard and differentiated. Orders to discontinue continuous monitoring and make daily NST.

## 2020-04-13 NOTE — Progress Notes (Signed)
Answered patient's request to speak to her nurse as assigned nurse was at lunch.  Patient requesting to go home and wants to speak to her doctor.  Phoned Dr. Earlene Plater with patient's request and she will be here to see the patient soon.

## 2020-04-13 NOTE — Discharge Summary (Signed)
Antenatal Physician Discharge Summary   PATIENT LEFT AGAINST MEDICAL ADVICE.  Patient ID: Julie Owens MRN: [id number] DOB/AGE: [birth year] 34 y.o.  Admit date: 04/11/2020 Discharge date: 04/13/2020  Admission Diagnoses: preterm labor  Discharge Diagnoses:  Active Problems:   Threatened preterm labor, third trimester   Dichorionic diamniotic twin pregnancy   Substance abuse affecting pregnancy in third trimester, antepartum   Limited prenatal care   Asthma   Tobacco use during pregnancy   IUGR (intrauterine growth restriction) affecting care of mother, first trimester, fetus 1   Depression affecting pregnancy   Chronic hypertension   [redacted] weeks gestation of pregnancy   Short interval between pregnancies affecting pregnancy in third trimester, antepartum   Grand multiparity   Poor compliance   Supervision of high risk pregnancy due to social problems   Tobacco abuse   Obesity in pregnancy   BMI 30s   Anemia in pregnancy   IUGR (intrauterine growth restriction) affecting care of mother, first trimester, fetus 2   Prenatal Procedures: NST and ultrasound  Consults: Neonatology  Hospital Course:  This is a 34 y.o. [id number] with IUP at [redacted]w[redacted]d admitted for contractions and intermittent vaginal bleeding. Previously admitted to [facility], given MgSO4 and BTMZ and left AMA. Presented here on 04/11/20 with bleeding and contractions. Pregnancy complicated by di/di twins, IUGR, cHTN, grand multiparity, limited PNC. She was found to be 5/60/-2 and admitted to labor. Started on GBS prophylaxis and magnesium for tocolysis. Received 2nd dose of betamethasone. Her contractions spaced out and eventually stopped and she was transferred to [facility]. Had increased contractions the following day and was transferred back to labor as she was very uncomfortable with pressure and unchanged cervical exam, however after she calmed, her contractions spaced out again. HD#3, patient had growth [place] and was  requesting to leave. RN asked me to come speak with her after patient told her she was leaving. I reviewed recommendation to stay in house given advanced cervical dilation and risks of fetal prematurity and death with potential delivery outside of hospital, reviewed maternal risks. Pt verbalizes understanding of risks and states that she plans to go home and rest on couch. States she needs to go take care of her other children. Plans to return to home in [place]. I again reviewed recommendation to stay in house given risks of fetal death, prematurity, maternal risks of hemorrhage with delivery. She verbalizes understanding and requests to leave. Pt signed AMA paperwork with RN. I asked her to call [facility] to have prenatal appointment tomorrow and she states she will do so.   Pt left AGAINST MEDICAL ADVICE.  Discharge Exam: Temp:  [97 F (36.1 C)-98.8 F (37.1 C)] 98.7 F (37.1 C) (08/26 1209) Pulse Rate:  [59-84] 72 (08/26 1209) Resp:  [17-20] 18 (08/26 1209) BP: (87-127)/(40-72) 123/72 (08/26 1209) SpO2:  [99 %-100 %] 100 % (08/26 1209) Weight:  [101.6 kg] 101.6 kg (08/25 1457) Physical Examination: CONSTITUTIONAL: Well-developed, well-nourished female in no acute distress.  HENT:  Normocephalic, atraumatic, External right and left ear normal. Oropharynx is clear and moist EYES: Conjunctivae and EOM are normal. Pupils are equal, round, and reactive to light. No scleral icterus.  NECK: Normal range of motion, supple, no masses SKIN: Skin is warm and dry. No rash noted. Not diaphoretic. No erythema. No pallor. NEUROLGIC: Alert and oriented to person, place, and time. Normal reflexes, muscle tone coordination. No cranial nerve deficit noted. Mildly pressure affect/speech PSYCHIATRIC: Normal mood and affect. Normal behavior. Normal judgment and thought  content. CARDIOVASCULAR: Normal heart rate noted RESPIRATORY: Effort normal, no problems with respiration noted MUSCULOSKELETAL: Normal range of  motion. No edema and no tenderness. 2+ distal pulses. ABDOMEN: Soft, nontender, nondistended, gravid. CERVIX: Dilation: 5 Effacement (%): 50 Station: -3 Presentation: Vertex Exam by:: Dr. Barb Merino, MD    Significant Diagnostic Studies:  Results for orders placed or performed during the hospital encounter of 04/11/20 (from the past 168 hour(s))  Wet prep, genital   Collection Time: 04/11/20  2:07 PM  Result Value Ref Range   Yeast Wet Prep HPF POC NONE SEEN NONE SEEN   Trich, Wet Prep NONE SEEN NONE SEEN   Clue Cells Wet Prep HPF POC PRESENT (A) NONE SEEN   WBC, Wet Prep HPF POC FEW (A) NONE SEEN   Sperm NONE SEEN   CBC   Collection Time: 04/11/20  2:07 PM  Result Value Ref Range   WBC 13.7 (H) 4.0 - 10.5 K/uL   RBC 2.97 (L) 3.87 - 5.11 MIL/uL   Hemoglobin 8.6 (L) 12.0 - 15.0 g/dL   HCT 00.7 (L) 36 - 46 %   MCV 93.9 80.0 - 100.0 fL   MCH 29.0 26.0 - 34.0 pg   MCHC 30.8 30.0 - 36.0 g/dL   RDW 62.2 63.3 - 35.4 %   Platelets 317 150 - 400 K/uL   nRBC 0.0 0.0 - 0.2 %  RPR   Collection Time: 04/11/20  2:07 PM  Result Value Ref Range   RPR Ser Ql NON REACTIVE NON REACTIVE  HIV Antibody (routine testing w rflx)   Collection Time: 04/11/20  2:07 PM  Result Value Ref Range   HIV Screen 4th Generation wRfx Non Reactive Non Reactive  GC/Chlamydia probe amp ()not at [facility]   Collection Time: 04/11/20  2:07 PM  Result Value Ref Range   Neisseria Gonorrhea Negative    Chlamydia Negative    Comment Normal Reference [place] Chlamydia - Negative    Comment      Normal Reference Range Neisseria Gonorrhea - Negative  Culture, beta strep (group b only)   Collection Time: 04/11/20  2:17 PM   Specimen: Vaginal/Rectal; Genital  Result Value Ref Range   Specimen Description VAGINAL/RECTAL    Special Requests NONE    Culture      CULTURE REINCUBATED FOR BETTER GROWTH Performed at [facility], [address] [place], [place] [address]    Report Status PENDING   Type and  screen [facility]   Collection Time: 04/11/20  2:17 PM  Result Value Ref Range   ABO/RH(D) AB POS    Antibody Screen NEG    Sample Expiration      04/14/2020,2359 Performed at [facility], [address] [place], [place] [address]    Unit Number [id number]    Blood Component Type RED CELLS,LR    Unit division 00    Status of Unit ALLOCATED    Transfusion Status OK TO TRANSFUSE    Crossmatch Result Compatible   BPAM RBC   Collection Time: 04/11/20  2:17 PM  Result Value Ref Range   Blood Product Unit Number [id number]    PRODUCT CODE [id number]    Unit Type and Rh 8400    Blood Product Expiration Date [id number]   ABO/Rh   Collection Time: 04/11/20  3:52 PM  Result Value Ref Range   ABO/RH(D)      AB POS Performed at [facility], [address], [place],  [place] [address]   SARS Coronavirus 2 by RT PCR (hospital order, performed in [facility] hospital lab) Nasopharyngeal Nasopharyngeal Swab   Collection Time: 04/11/20  4:52 PM   Specimen: Nasopharyngeal Swab  Result Value Ref Range   SARS Coronavirus 2 NEGATIVE NEGATIVE  Glucose, capillary   Collection Time: 04/11/20  5:18 PM  Result Value Ref Range   Glucose-Capillary 105 (H) 70 - 99 mg/dL  Urine rapid drug screen (hosp performed)   Collection Time: 04/11/20  5:23 PM  Result Value Ref Range   Opiates NONE DETECTED NONE DETECTED   Cocaine NONE DETECTED NONE DETECTED   Benzodiazepines NONE DETECTED NONE DETECTED   Amphetamines NONE DETECTED NONE DETECTED   Tetrahydrocannabinol POSITIVE (A) NONE DETECTED   Barbiturates NONE DETECTED NONE DETECTED  Urinalysis, Routine w reflex microscopic Urine, Clean Catch   Collection Time: 04/11/20  5:23 PM  Result Value Ref Range   Color, Urine YELLOW YELLOW   APPearance CLEAR CLEAR   Specific Gravity, Urine 1.008 1.005 - 1.030   pH 6.0 5.0 - 8.0   Glucose, UA NEGATIVE NEGATIVE mg/dL   Hgb urine dipstick LARGE (A) NEGATIVE   Bilirubin  Urine NEGATIVE NEGATIVE   Ketones, ur 5 (A) NEGATIVE mg/dL   Protein, ur NEGATIVE NEGATIVE mg/dL   Nitrite NEGATIVE NEGATIVE   Leukocytes,Ua TRACE (A) NEGATIVE   RBC / HPF 0-5 0 - 5 RBC/hpf   WBC, UA 6-10 0 - 5 WBC/hpf   Bacteria, UA RARE (A) NONE SEEN   Squamous Epithelial / LPF 6-10 0 - 5   Mucus PRESENT   Glucose, capillary   Collection Time: 04/11/20  9:12 PM  Result Value Ref Range   Glucose-Capillary 139 (H) 70 - 99 mg/dL  Glucose, capillary   Collection Time: 04/12/20  5:31 AM  Result Value Ref Range   Glucose-Capillary 119 (H) 70 - 99 mg/dL  Glucose, capillary   Collection Time: 04/12/20  8:52 AM  Result Value Ref Range   Glucose-Capillary 124 (H) 70 - 99 mg/dL  Glucose, capillary   Collection Time: 04/12/20 12:15 PM  Result Value Ref Range   Glucose-Capillary 110 (H) 70 - 99 mg/dL  Prepare RBC (crossmatch)   Collection Time: 04/12/20  3:30 PM  Result Value Ref Range   Order Confirmation      ORDER PROCESSED BY BLOOD BANK Performed at [facility], [address], [place], [place] [address]   Glucose, capillary   Collection Time: 04/12/20 11:23 PM  Result Value Ref Range   Glucose-Capillary 107 (H) 70 - 99 mg/dL  Glucose, capillary   Collection Time: 04/13/20  8:15 AM  Result Value Ref Range   Glucose-Capillary 95 70 - 99 mg/dL  Glucose, capillary   Collection Time: 04/13/20 12:10 PM  Result Value Ref Range   Glucose-Capillary 93 70 - 99 mg/dL    Discharge Condition: Stable  Disposition: HOME, LEFT AGAINST MEDICAL ADVICE    Signed: Baldemar Lenis, M.D. Attending [facility] for [employer] (Faculty Practice)  04/13/2020, 1:14 PM

## 2020-04-13 NOTE — Progress Notes (Signed)
Labor Progress Note Joanny Dupree is a 34 y.o. [id number] at [redacted]w[redacted]d presenting for threatened preterm labor and vaginal bleeding.  S: Pt resting comfortably s/p recent dose of IV pain medication. No recent contractions per pt.  O:  BP 104/60   Pulse 76   Temp 98 F (36.7 C) (Oral)   Resp 20   Ht 5\' 5"  (1.651 m)   Wt 101.6 kg   SpO2 100%   BMI 37.28 kg/m  EFM:  A 120 baseline, no accel or decel, min to mod variability B 120 baseline, no accel or decel, min to mod variability Toco: quiet Gen: NAD Abdomen: gravid, nttp SVE: 4-5/50/mid position/medium consistency/-3/cephalic  CVE: Dilation: 5 Effacement (%): 50 Station: -3 Presentation: Vertex Exam by:: Dr. [email or web address], MD   A&P: 34 y.o. 34 [redacted]w[redacted]d presenting for threatened preterm labor and vaginal bleeding.  *IUP: category I-II x 2. Plan to contact [facility] today to try and get mid August growth u/s results. No mention of abnormal dopplers in care everywhere. Will ask about BTL later. Needs GDM screening at some point.  *PTL: S/p procardia x2 on 8/24. Pt was also initially continued on Mg but reassuringly, unchanged cervical exam 6 hours s/p discontinuation of Mg at 2000. Will plan for transfer to antepartum given stable cervical exam with quiet toco off Mg. *Di-Di twins: cephalic/cephalic on ultrasound in AM on 8/25. Pt s/p discussion with Dr. 9/25 re: delivery mode and if A or B becomes non cephalic the recommendation for c-section due to FGR x 2. If stays cephalic/cephalic then pt may attempt a vaginal delivery, which she desires.  *Preterm: BMZ #2 on 8/24 @ 1430. S/p Mg for fetal NP. S/p NICU consult. *cHTN: no current issues *Anemia: one unit PRBC on hold.  *GBS: unknown. S/p multiple doses of amp and PCN. Will discontinue antibiotics at this time given transfer to antepartum with no recent signs of active labor. *Social: SW PRN *Analgesia: no current needs. No epidural in place  Sharnette Kitamura, 9/24, MD OB Fellow, Faculty  Practice 04/13/2020 2:32 AM

## 2020-04-14 LAB — TYPE AND SCREEN
ABO/RH(D): AB POS
Antibody Screen: NEGATIVE
Unit division: 0

## 2020-04-14 LAB — BPAM RBC
Blood Product Expiration Date: 202109072359
Unit Type and Rh: 8400

## 2021-09-15 IMAGING — US US MFM OB DETAIL+14 WK
1 series · 14 of 28 positions shown · non-contrast
Comparison: none

[Series 1: us mfm ob detail+14 wk · 130 acquisitions, 14 frames shown]
[im 5/130]
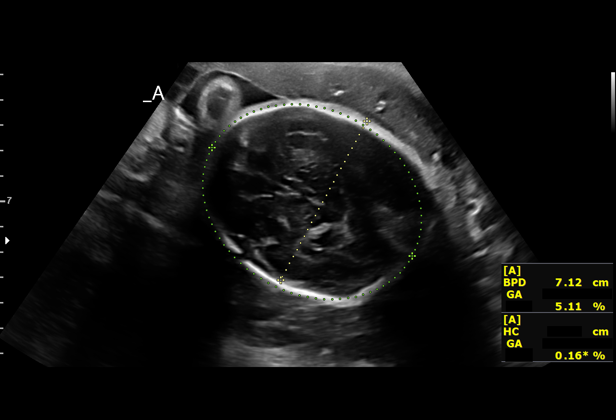
[im 15/130]
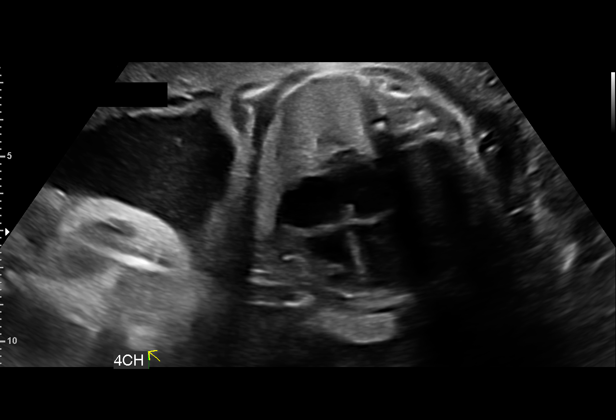
[im 24/130]
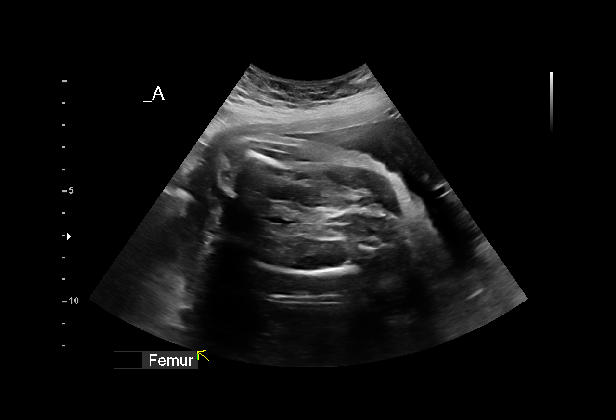
[im 34/130]
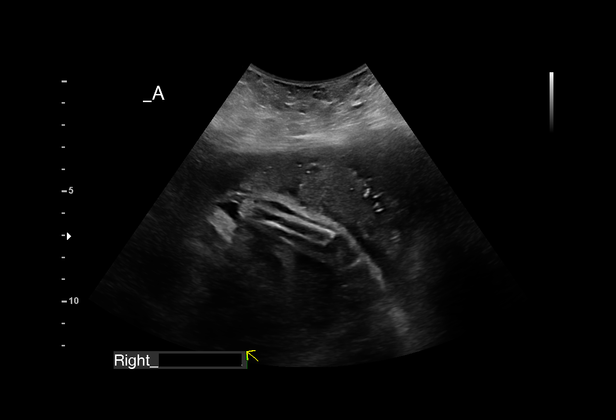
[im 44/130]
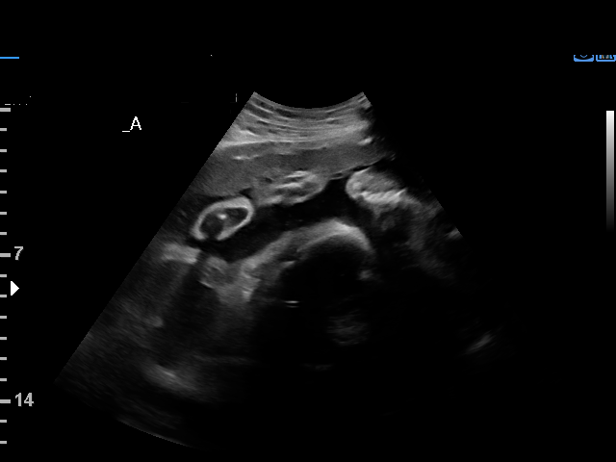
[im 53/130]
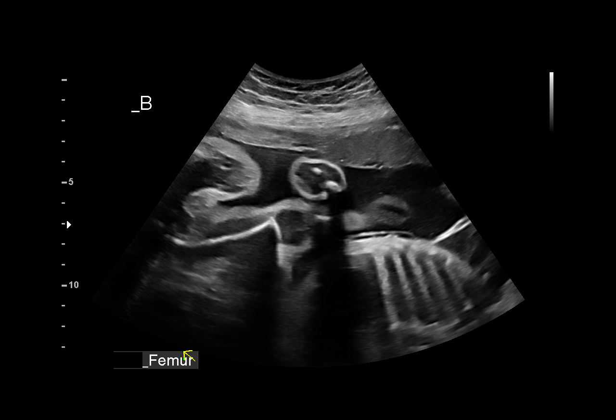
[im 63/130]
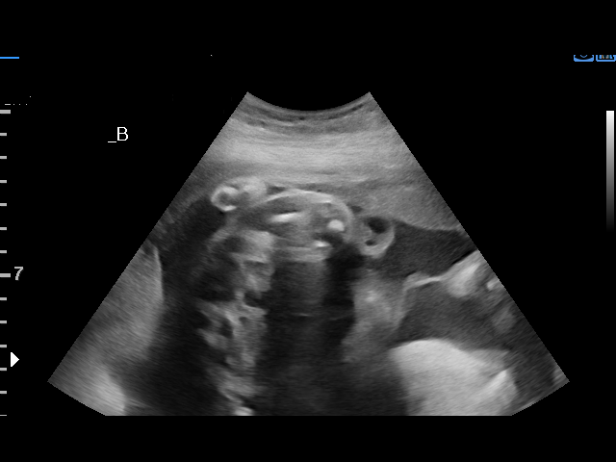
[im 72/130]
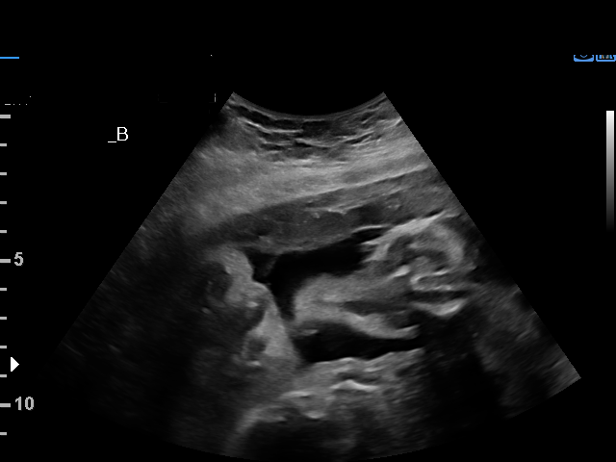
[im 82/130]
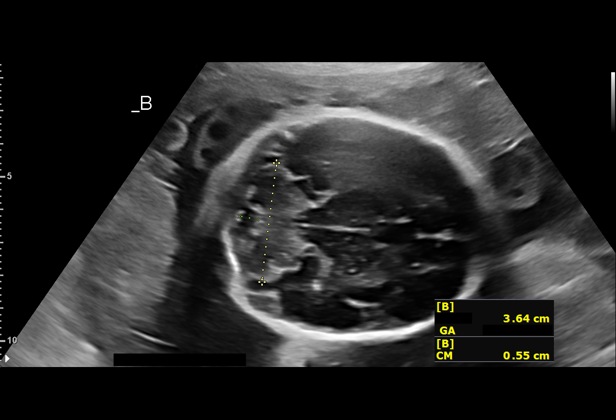
[im 91/130]
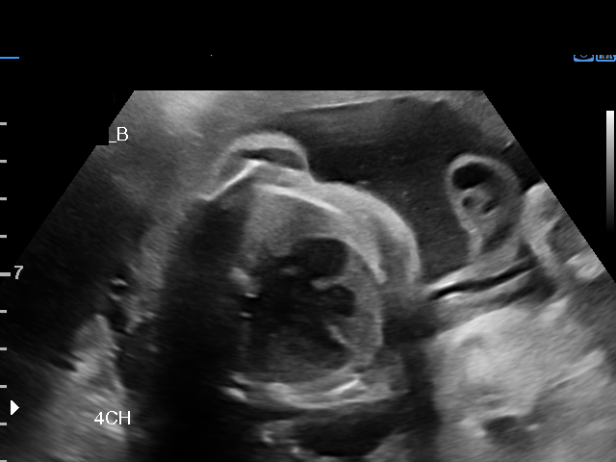
[im 101/130]
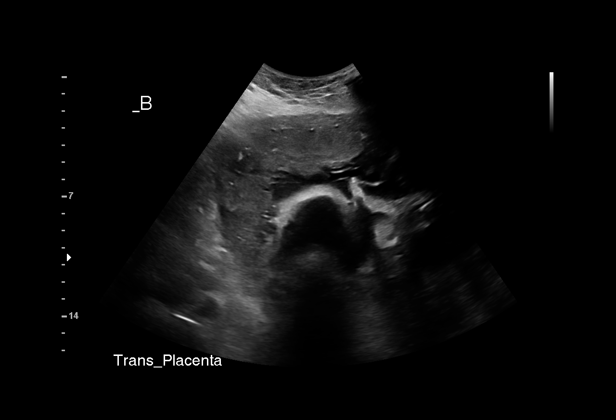
[im 110/130]
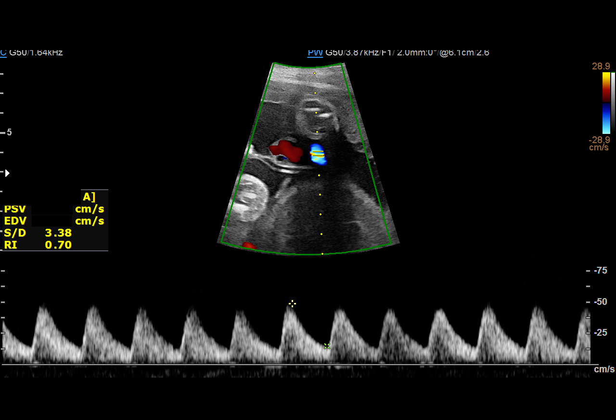
[im 120/130]
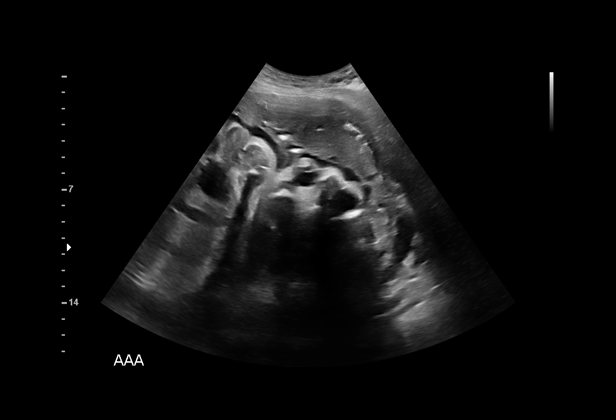
[im 130/130]
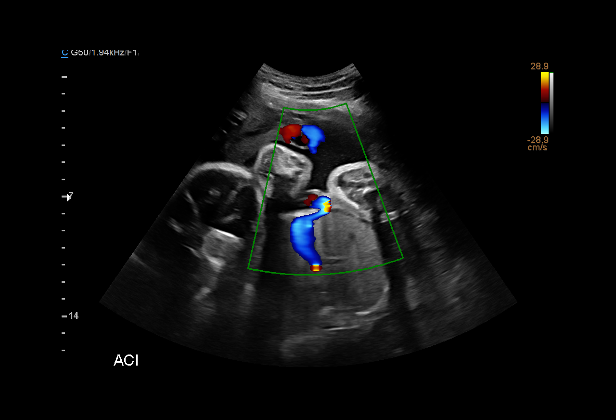

[14 of 28 positions shown; findings below may reference images not displayed]

ADDL GESTATION
    +14 WK
 6  US MFM UA CORD DOPPLER                76820.02    DIAMANTE JM

Indications

 Twin pregnancy, di/di, third trimester
 Hypertension - Chronic/Pre-existing
 Maternal care for known or suspected poor
 fetal growth, third trimester, not applicable or
 unspecified IUGR (last MFM US on [DATE]
 showed EFW of 1100g and 1000g)
 30 weeks gestation of pregnancy
 Substance abuse affecting pregnancy,
 antepartum (per pt [REDACTED] chart [DATE]
 amphetamines and cannabis)
 Asthma                                         444.N4 [id number]
 Preterm labor
 Poor obstetric history: Previous gestational
 diabetes
 Vaginal bleeding in pregnancy, third trimester
 Short interval between pregancies, 3rd
 trimester
 Grand multiparity, antepartum
 Tobacco use complicating pregnancy, third
 trimester
 Obesity complicating pregnancy, third
 trimester
 Encounter for antenatal screening for
 malformations
 Insufficient Prenatal Care (2 visits)
Vital Signs

 BMI:
Fetal Evaluation (Fetus A)

 Num Of Fetuses:         2
 Fetal Heart Rate(bpm):  148
 Cardiac Activity:       Observed
 Fetal Lie:              Lower Fetus, Mat LT side
 Presentation:           Cephalic
 Placenta:               Anterior
 P. Cord Insertion:      Not well visualized
 Membrane Desc:      Dividing Membrane seen - Dichorionic.

 Amniotic Fluid
 AFI FV:      Within normal limits

                             Largest Pocket(cm)

Biophysical Evaluation (Fetus A)

 Amniotic F.V:   Within normal limits       F. Tone:        Observed
 F. Movement:    Observed                   Score:          [DATE]
 F. Breathing:   Not Observed
Biometry (Fetus A)

 BPD:      71.6  mm     G. Age:  28w 5d          7  %    CI:        76.27   %    70 - 86
                                                         FL/HC:      21.0   %    19.2 -
 HC:      259.8  mm     G. Age:  28w 2d        < 1  %    HC/AC:      1.05        0.99 -
 AC:      248.4  mm     G. Age:  29w 0d         16  %    FL/BPD:     76.1   %    71 - 87
 FL:       54.5  mm     G. Age:  28w 6d          8  %    FL/AC:      21.9   %    20 - 24
 HUM:      51.2  mm     G. Age:  30w 0d         49  %

 Est. FW:    8622  gm    2 lb 14 oz       8  %     FW Discordancy      0 \ 8 %
OB History

 Gravidity:    9         Term:   6         SAB:   2
 Living:       6
Gestational Age (Fetus A)

 Clinical EDD:  30w 1d                                        EDD:   06/21/20
 U/S Today:     28w 5d                                        EDD:   07/01/20
 Best:          30w 1d     Det. By:  Clinical EDD             EDD:   06/21/20
Anatomy (Fetus A)

 Cranium:               Appears normal         LVOT:                   Not well visualized
 Cavum:                 Not well visualized    Aortic Arch:            Not well visualized
 Ventricles:            Not well visualized    Ductal Arch:            Appears normal
 Choroid Plexus:        Appears normal         Diaphragm:              Appears normal
 Cerebellum:            Not well visualized    Stomach:                Appears normal, left
                                                                       sided
 Posterior Fossa:       Not well visualized    Abdomen:                Appears normal
 Nuchal Fold:           Not applicable (>20    Abdominal Wall:         Appears nml (cord
                        wks GA)                                        insert, abd wall)
 Face:                  Appears normal         Cord Vessels:           Appears normal (3
                        (orbits and profile)                           vessel cord)
 Lips:                  Not well visualized    Kidneys:                Appear normal
 Palate:                Not well visualized    Bladder:                Appears normal
 Thoracic:              Appears normal         Spine:                  Not well visualized
 Heart:                 Not well visualized    Upper Extremities:      RT nl; LUE not well
                                                                       vis
 RVOT:                  Not well visualized    Lower Extremities:      Visualized

 Other:  Hands and feet not well visualized. Technically difficult due to
         advanced GA and fetal position.
Doppler - Fetal Vessels (Fetus A)

 Umbilical Artery
  S/D     %tile      RI    %tile                             ADFV    RDFV
  3.26       74    0.69       75                                No      No

Fetal Evaluation (Fetus B)

 Num Of Fetuses:         2
 Fetal Heart Rate(bpm):  138
 Cardiac Activity:       Observed
 Fetal Lie:              Upper Fetus, Mat RT side
 Presentation:           Breech
 Placenta:               Anterior
 P. Cord Insertion:      Visualized
 Membrane Desc:      Dividing Membrane seen - Dichorionic.

 Amniotic Fluid
 AFI FV:      Within normal limits

                             Largest Pocket(cm)

Biophysical Evaluation (Fetus B)

 Amniotic F.V:   Within normal limits       F. Tone:        Observed
 F. Movement:    Observed                   Score:          [DATE]
 F. Breathing:   Not Observed
Biometry (Fetus B)

 BPD:      68.5  mm     G. Age:  27w 4d        < 1  %    CI:         72.2   %    70 - 86
                                                         FL/HC:      21.4   %    19.2 -
 HC:      256.5  mm     G. Age:  27w 6d        < 1  %    HC/AC:      1.09        0.99 -
 AC:      235.2  mm     G. Age:  27w 6d        2.4  %    FL/BPD:     80.0   %    71 - 87
 FL:       54.8  mm     G. Age:  29w 0d         10  %    FL/AC:      23.3   %    20 - 24
 HUM:        48  mm     G. Age:  28w 1d         13  %
 CER:      36.4  mm     G. Age:  30w 2d         35  %

 LV:        1.3  mm
 CM:        5.5  mm

 Est. FW:    1151  gm    2 lb 10 oz     2.4  %     FW Discordancy         8  %
Gestational Age (Fetus B)

 Clinical EDD:  30w 1d                                        EDD:   06/21/20
 U/S Today:     28w 1d                                        EDD:   07/05/20
 Best:          30w 1d     Det. By:  Clinical EDD             EDD:   06/21/20
Anatomy (Fetus B)

 Cranium:               Appears normal         LVOT:                   Not well visualized
 Cavum:                 Appears normal         Aortic Arch:            Not well visualized
 Ventricles:            Appears normal         Ductal Arch:            Not well visualized
 Choroid Plexus:        Appears normal         Diaphragm:              Not well visualized
 Cerebellum:            Appears normal         Stomach:                Appears normal, left
                                                                       sided
 Posterior Fossa:       Appears normal         Abdomen:                Appears normal
 Nuchal Fold:           Not applicable (>20    Abdominal Wall:         Not well visualized
                        wks GA)
 Face:                  Appears normal         Cord Vessels:           Appears normal (3
                        (orbits and profile)                           vessel cord)
 Lips:                  Not well visualized    Kidneys:                Appear normal
 Palate:                Not well visualized    Bladder:                Appears normal
 Thoracic:              Appears normal         Spine:                  Not well visualized
 Heart:                 Not well visualized    Upper Extremities:      RT visualized; LUE
                                                                       not well seen
 RVOT:                  Not well visualized    Lower Extremities:      Visualized

 Other:  Hands and feet not well visualized. Technically difficult due to
         advanced GA and fetal position.
Doppler - Fetal Vessels (Fetus B)

 Umbilical Artery
  S/D     %tile      RI    %tile                             ADFV    RDFV
  2.99       59    0.67       66                                No      No

Cervix Uterus Adnexa

 Cervix
 Not visualized (advanced GA >94wks)
Impression

 Twin gestational diamniotic dichorionic
 Stomach, Bladder and amniotic fluid was normal and well
 visualized.
 Biophysical profile for Twin A & B was [REDACTED]for breathing.
 IUGR seen in both twins. Twin A 8% Twin B 2.4%
 The UA Dopplers were < 95th% with no evidence of absent or
 distal EW.

Recommendations

 Initiate weekly testing with UA Dopplers
 Repeat growth in 4 weeks.
 Consider delivery 36-37 weeks EFW < 3% in Twin B.
